# Patient Record
Sex: Male | Born: 1975 | Hispanic: Yes
Health system: Southern US, Community
[De-identification: ages and names within clinical notes are randomized; demographics above are authoritative.]

---

## 2021-01-06 ENCOUNTER — Emergency Department (HOSPITAL_COMMUNITY): Payer: Self-pay

## 2021-01-06 ENCOUNTER — Emergency Department (HOSPITAL_COMMUNITY)
Admission: EM | Admit: 2021-01-06 | Discharge: 2021-01-06 | Disposition: A | Discharge status: Home / Self Care | Payer: Self-pay | Attending: Emergency Medicine | Admitting: Emergency Medicine

## 2021-01-06 DIAGNOSIS — S50312A Abrasion of left elbow, initial encounter: Secondary | ICD-10-CM | POA: Insufficient documentation

## 2021-01-06 DIAGNOSIS — R55 Syncope and collapse: Secondary | ICD-10-CM | POA: Insufficient documentation

## 2021-01-06 DIAGNOSIS — S0990XA Unspecified injury of head, initial encounter: Secondary | ICD-10-CM | POA: Insufficient documentation

## 2021-01-06 DIAGNOSIS — S80212A Abrasion, left knee, initial encounter: Secondary | ICD-10-CM | POA: Insufficient documentation

## 2021-01-06 DIAGNOSIS — Y900 Blood alcohol level of less than 20 mg/100 ml: Secondary | ICD-10-CM | POA: Insufficient documentation

## 2021-01-06 DIAGNOSIS — R0789 Other chest pain: Secondary | ICD-10-CM | POA: Insufficient documentation

## 2021-01-06 DIAGNOSIS — R4189 Other symptoms and signs involving cognitive functions and awareness: Secondary | ICD-10-CM

## 2021-01-06 DIAGNOSIS — S50311A Abrasion of right elbow, initial encounter: Secondary | ICD-10-CM | POA: Insufficient documentation

## 2021-01-06 DIAGNOSIS — S80211A Abrasion, right knee, initial encounter: Secondary | ICD-10-CM | POA: Insufficient documentation

## 2021-01-06 DIAGNOSIS — R4182 Altered mental status, unspecified: Secondary | ICD-10-CM | POA: Insufficient documentation

## 2021-01-06 LAB — CBC WITH DIFFERENTIAL/PLATELET
Abs Immature Granulocytes: 0.18 10*3/uL — ABNORMAL HIGH (ref 0.00–0.07)
Basophils Absolute: 0.1 10*3/uL (ref 0.0–0.1)
Basophils Relative: 0 %
Eosinophils Absolute: 0.2 10*3/uL (ref 0.0–0.5)
Eosinophils Relative: 1 %
HCT: 40.8 % (ref 39.0–52.0)
Hemoglobin: 13.8 g/dL (ref 13.0–17.0)
Immature Granulocytes: 1 %
Lymphocytes Relative: 10 %
Lymphs Abs: 1.6 10*3/uL (ref 0.7–4.0)
MCH: 31 pg (ref 26.0–34.0)
MCHC: 33.8 g/dL (ref 30.0–36.0)
MCV: 91.7 fL (ref 80.0–100.0)
Monocytes Absolute: 0.9 10*3/uL (ref 0.1–1.0)
Monocytes Relative: 6 %
Neutro Abs: 12.8 10*3/uL — ABNORMAL HIGH (ref 1.7–7.7)
Neutrophils Relative %: 82 %
Platelets: 326 10*3/uL (ref 150–400)
RBC: 4.45 MIL/uL (ref 4.22–5.81)
RDW: 14 % (ref 11.5–15.5)
WBC: 15.7 10*3/uL — ABNORMAL HIGH (ref 4.0–10.5)
nRBC: 0 % (ref 0.0–0.2)

## 2021-01-06 LAB — TROPONIN I (HIGH SENSITIVITY)
Troponin I (High Sensitivity): 7 ng/L (ref <18)
Troponin I (High Sensitivity): 7 ng/L (ref <18)

## 2021-01-06 LAB — COMPREHENSIVE METABOLIC PANEL
ALT: 19 U/L (ref 0–44)
AST: 20 U/L (ref 15–41)
Albumin: 3.6 g/dL (ref 3.5–5.0)
Alkaline Phosphatase: 50 U/L (ref 38–126)
Anion gap: 13 (ref 5–15)
BUN: 14 mg/dL (ref 6–20)
CO2: 19 mmol/L — ABNORMAL LOW (ref 22–32)
Calcium: 9.1 mg/dL (ref 8.9–10.3)
Chloride: 105 mmol/L (ref 98–111)
Creatinine, Ser: 1.03 mg/dL (ref 0.61–1.24)
GFR, Estimated: >60 mL/min (ref >60)
Glucose, Bld: 96 mg/dL (ref 70–99)
Potassium: 3.7 mmol/L (ref 3.5–5.1)
Sodium: 137 mmol/L (ref 135–145)
Total Bilirubin: 0.3 mg/dL (ref 0.3–1.2)
Total Protein: 6.7 g/dL (ref 6.5–8.1)

## 2021-01-06 LAB — AMMONIA: Ammonia: 38 umol/L — ABNORMAL HIGH (ref 9–35)

## 2021-01-06 LAB — ACETAMINOPHEN LEVEL: Acetaminophen (Tylenol), Serum: <10 ug/mL — ABNORMAL LOW (ref 10–30)

## 2021-01-06 LAB — SALICYLATE LEVEL: Salicylate Lvl: <7 mg/dL — ABNORMAL LOW (ref 7.0–30.0)

## 2021-01-06 LAB — ETHANOL: Alcohol, Ethyl (B): <10 mg/dL (ref <10)

## 2021-01-06 MED ORDER — SODIUM CHLORIDE 0.9 % IV BOLUS
1000.0000 mL | Freq: Once | INTRAVENOUS | Status: AC
  Administered 2021-01-06: 1000 mL via INTRAVENOUS
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
## 2021-01-06 NOTE — ED Notes (Signed)
Report given to Elizabeth M, RN & Travis, RN   

## 2022-10-15 IMAGING — CT CT HEAD W/O CM
5 series · 17 of 47 positions shown, 19 images · non-contrast
Comparison: CT head 01/06/2021 [DATE] a.m.

CLINICAL DATA: Fall/head injury. Repeat request per Radiologist due
to motion. Patient intoxicated.

EXAM:
CT HEAD WITHOUT CONTRAST
TECHNIQUE: Contiguous axial images were obtained from the base of the skull
through the vertex without intravenous contrast.

[Series 2: head without · axial · non-contrast · 0.42mm/px · z∈[-107,+8]mm · 5 of 35 slices shown, 7 images]
[im 6/35  brain]
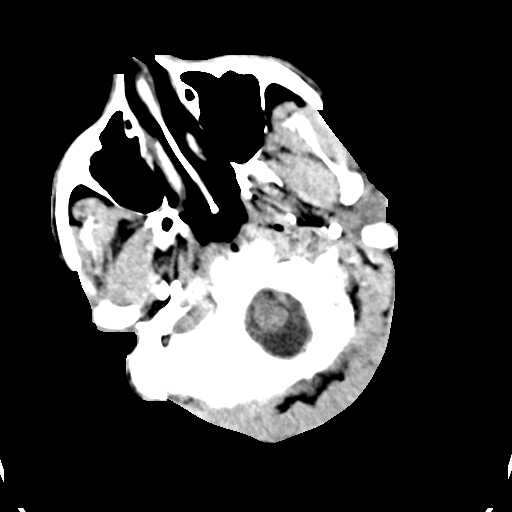
[im 6/35  bone]
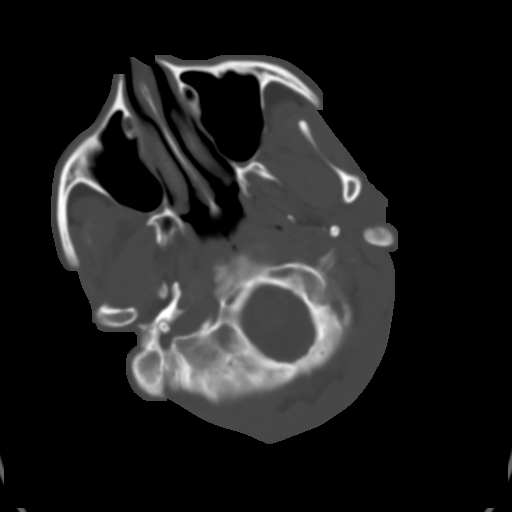
[im 12/35  brain]
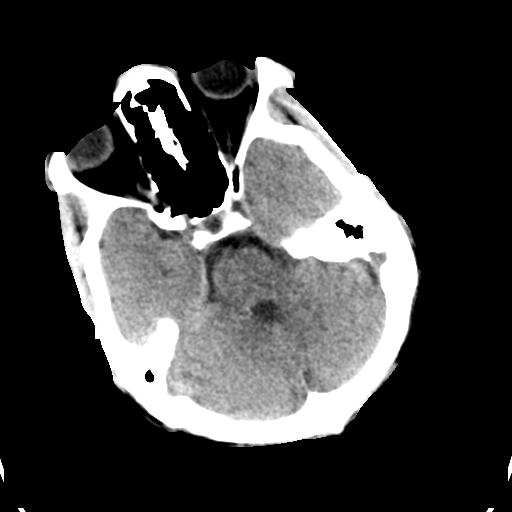
[im 18/35  brain]
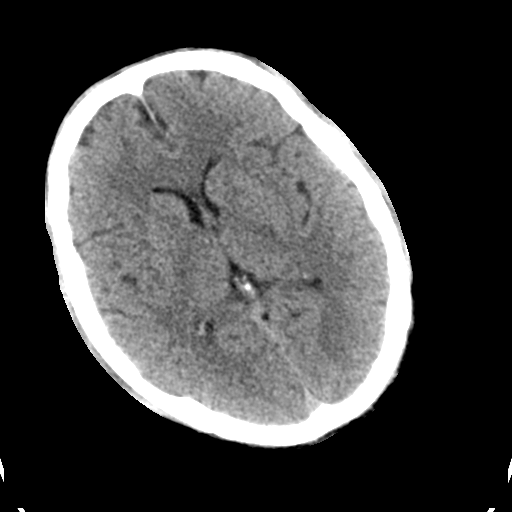
[im 23/35  brain]
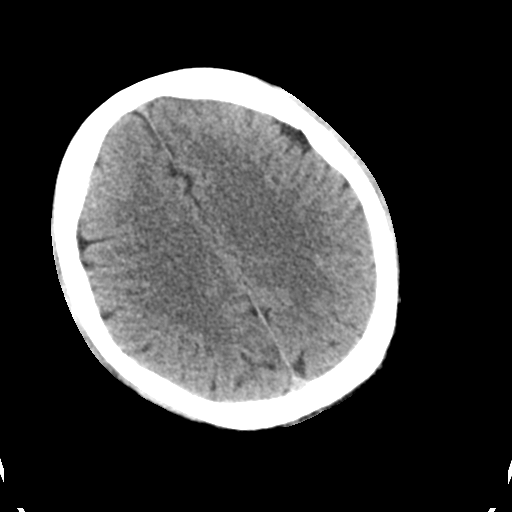
[im 29/35  brain]
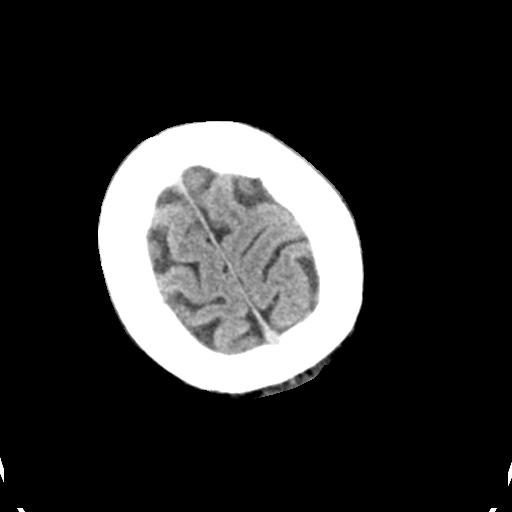
[im 29/35  bone]
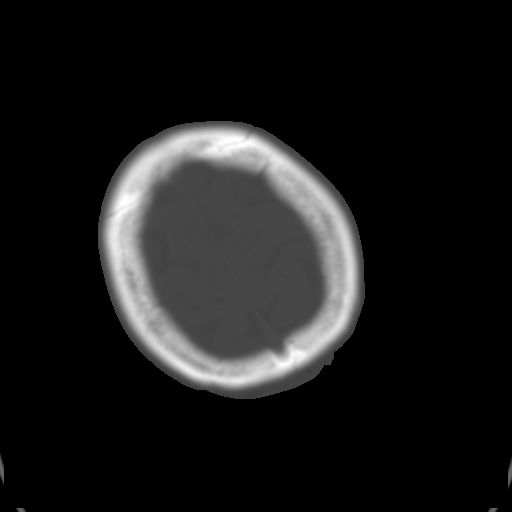

[Series 3: head bone · axial · 0.42mm/px · z∈[-122,-100]mm · 2 of 88 slices shown]
[im 6/88  bone]
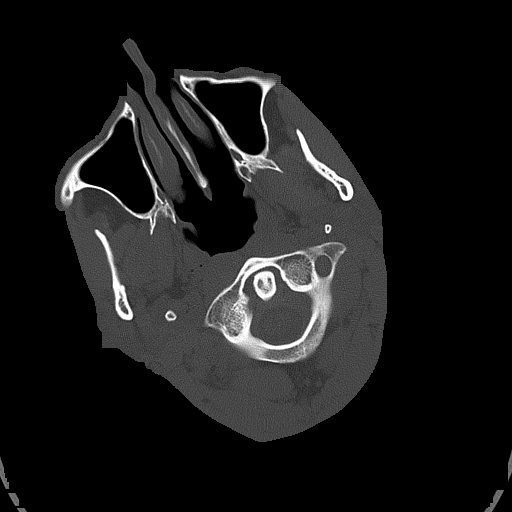
[im 17/88  bone]
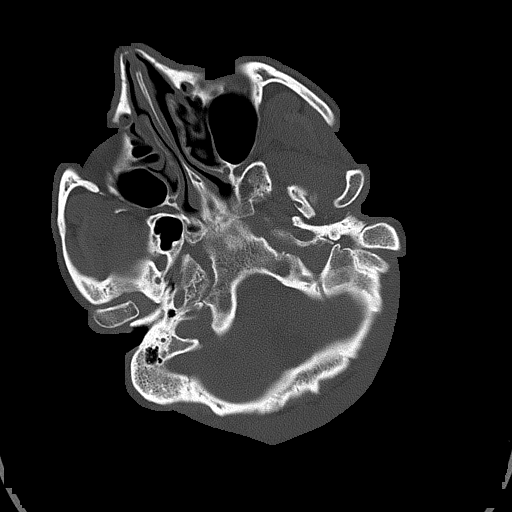

[Series 4: head without cor · coronal · non-contrast · 0.31mm/px · 3 of 67 slices shown]
[im 23/67  brain]
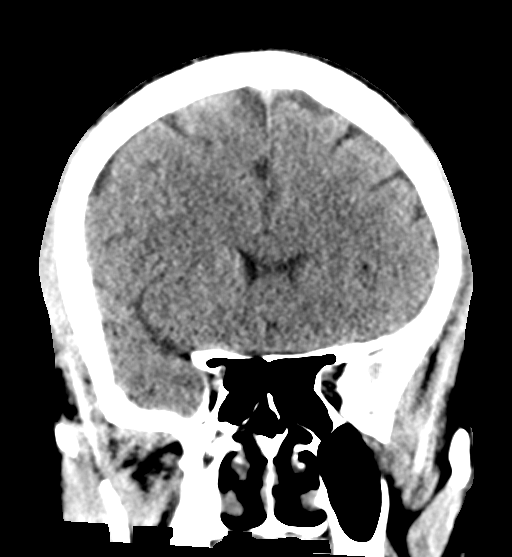
[im 30/67  brain]
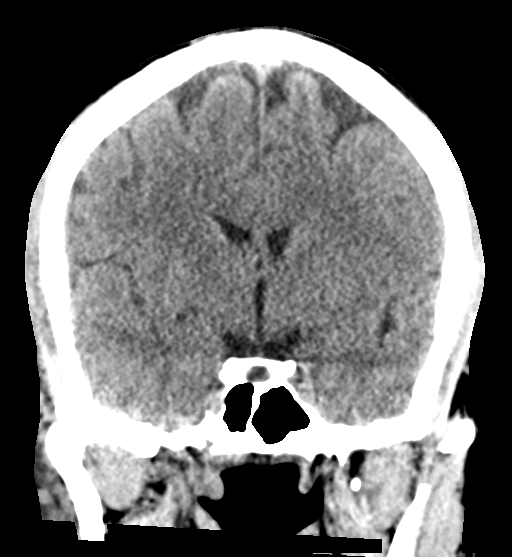
[im 37/67  brain]
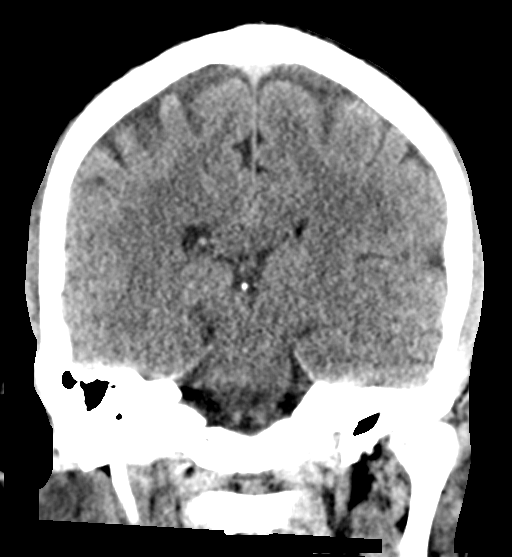

[Series 5: head without sag · sagittal · non-contrast · 0.34mm/px · 3 of 62 slices shown]
[im 21/62  brain]
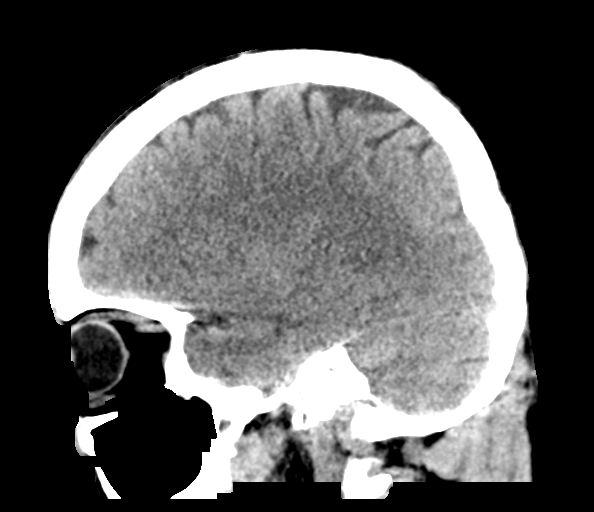
[im 31/62  brain]
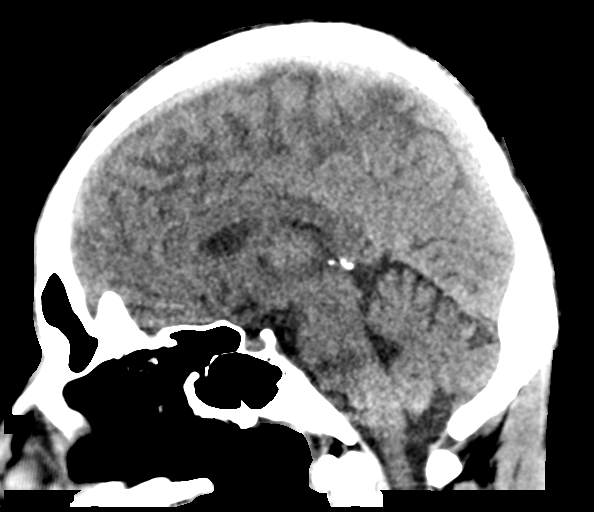
[im 41/62  brain]
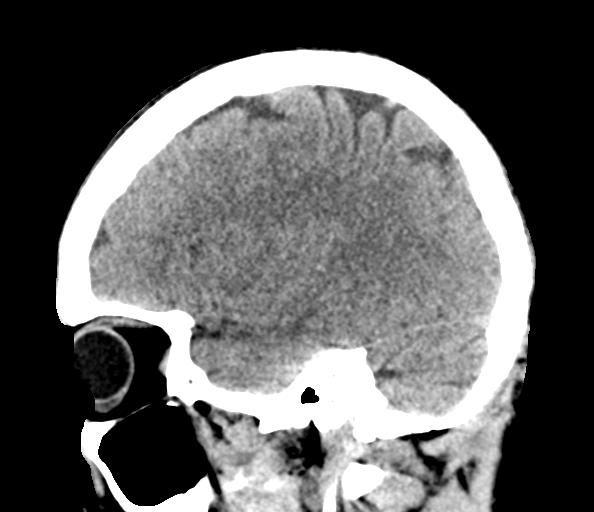

[Series 6: head without ax · axial · non-contrast · 0.42mm/px · z∈[-97,+3]mm · 4 of 34 slices shown]
[im 7/34  brain]
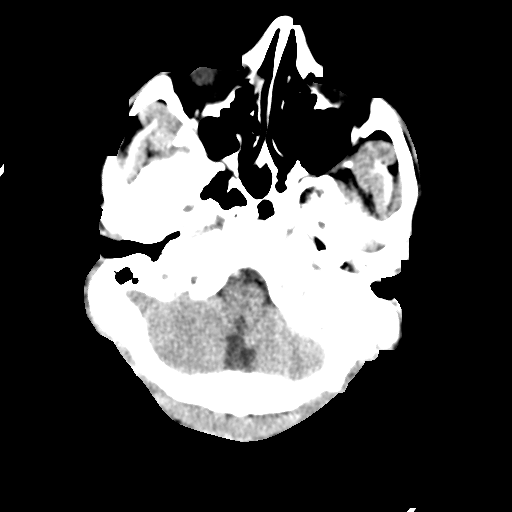
[im 14/34  brain]
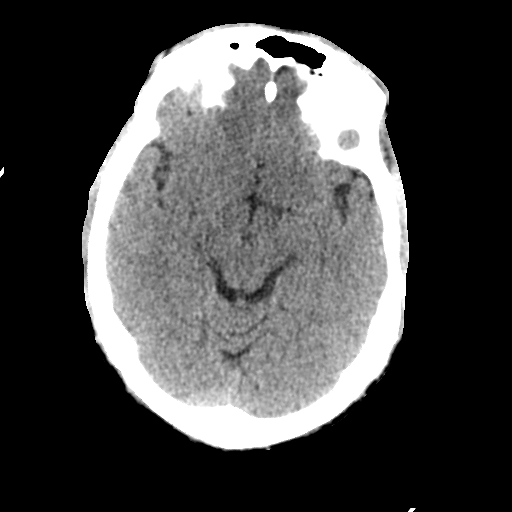
[im 20/34  brain]
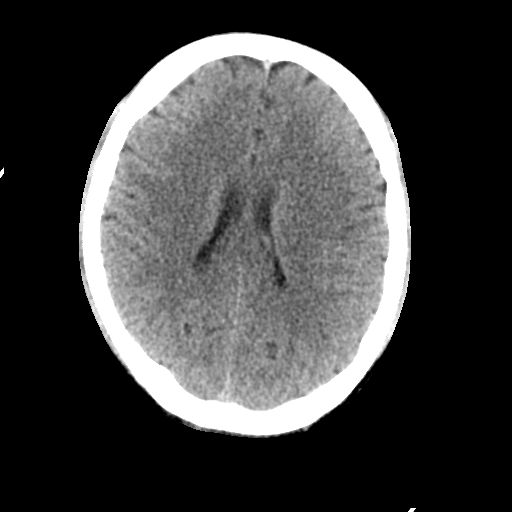
[im 27/34  brain]
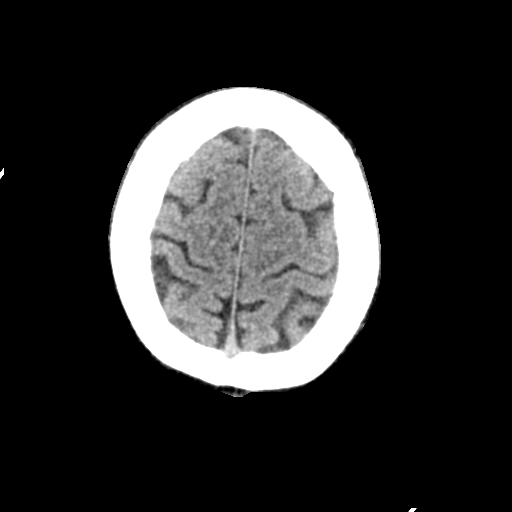

[17 of 47 positions shown; findings below may reference images not displayed]

FINDINGS: Brain:

No evidence of large-territorial acute infarction. No parenchymal
hemorrhage. No mass lesion. No extra-axial collection.

No mass effect or midline shift. No hydrocephalus. Basilar cisterns
are patent.

Vascular: No hyperdense vessel.

Skull: No acute fracture or focal lesion.

Sinuses/Orbits: Paranasal sinuses and mastoid air cells are clear.
The orbits are unremarkable.

Other: None.
IMPRESSION: No acute intracranial abnormality.
